# Patient Record
Sex: Female | Born: 2000 | Race: Black or African American | Hispanic: No | Marital: Single | Smoking: Former smoker
Health system: Southern US, Community
[De-identification: ages and names within clinical notes are randomized; demographics above are authoritative.]

## PROBLEM LIST (undated history)

## (undated) ENCOUNTER — Inpatient Hospital Stay (HOSPITAL_COMMUNITY): Payer: Self-pay

## (undated) DIAGNOSIS — J45909 Unspecified asthma, uncomplicated: Secondary | ICD-10-CM

## (undated) HISTORY — PX: NO PAST SURGERIES: SHX2092

---

## 2018-06-11 ENCOUNTER — Other Ambulatory Visit: Payer: Self-pay

## 2018-06-11 ENCOUNTER — Emergency Department (HOSPITAL_COMMUNITY)
Admission: EM | Admit: 2018-06-11 | Discharge: 2018-06-11 | Disposition: A | Discharge status: Home / Self Care | Payer: BLUE CROSS/BLUE SHIELD | Attending: Emergency Medicine | Admitting: Emergency Medicine

## 2018-06-11 ENCOUNTER — Encounter (HOSPITAL_COMMUNITY): Payer: Self-pay | Admitting: *Deleted

## 2018-06-11 DIAGNOSIS — J45909 Unspecified asthma, uncomplicated: Secondary | ICD-10-CM | POA: Diagnosis not present

## 2018-06-11 DIAGNOSIS — N898 Other specified noninflammatory disorders of vagina: Secondary | ICD-10-CM | POA: Diagnosis present

## 2018-06-11 DIAGNOSIS — B3731 Acute candidiasis of vulva and vagina: Secondary | ICD-10-CM

## 2018-06-11 DIAGNOSIS — Z3A01 Less than 8 weeks gestation of pregnancy: Secondary | ICD-10-CM | POA: Diagnosis not present

## 2018-06-11 DIAGNOSIS — O23599 Infection of other part of genital tract in pregnancy, unspecified trimester: Secondary | ICD-10-CM | POA: Insufficient documentation

## 2018-06-11 DIAGNOSIS — B373 Candidiasis of vulva and vagina: Secondary | ICD-10-CM | POA: Diagnosis not present

## 2018-06-11 HISTORY — DX: Unspecified asthma, uncomplicated: J45.909

## 2018-06-11 LAB — WET PREP, GENITAL
CLUE CELLS WET PREP: NONE SEEN
SPERM: NONE SEEN
TRICH WET PREP: NONE SEEN

## 2018-06-11 LAB — RPR: RPR Ser Ql: NONREACTIVE

## 2018-06-11 LAB — HIV ANTIBODY (ROUTINE TESTING W REFLEX): HIV SCREEN 4TH GENERATION: NONREACTIVE

## 2018-06-11 LAB — GC/CHLAMYDIA PROBE AMP (~~LOC~~) NOT AT ARMC
Chlamydia: NEGATIVE
Neisseria Gonorrhea: NEGATIVE

## 2018-06-11 LAB — I-STAT BETA HCG BLOOD, ED (MC, WL, AP ONLY): I-stat hCG, quantitative: 40.8 m[IU]/mL — ABNORMAL HIGH (ref <5)

## 2018-06-11 MED ORDER — FLUCONAZOLE 150 MG PO TABS: 150.0000 mg | ORAL_TABLET | Freq: Every day | ORAL | 1 refills | Status: AC

## 2018-06-11 MED ORDER — AZITHROMYCIN 250 MG PO TABS
1000.0000 mg | ORAL_TABLET | Freq: Once | ORAL | Status: AC
  Administered 2018-06-11: 1000 mg via ORAL
  Filled 2018-06-11: qty 4

## 2018-06-11 MED ORDER — PRENATAL COMPLETE 14-0.4 MG PO TABS: 1.0000 | ORAL_TABLET | Freq: Every day | ORAL | 0 refills | Status: AC

## 2018-06-11 MED ORDER — LIDOCAINE HCL 1 % IJ SOLN
INTRAMUSCULAR | Status: AC
  Administered 2018-06-11: 2.1 mL
  Filled 2018-06-11: qty 20

## 2018-06-11 MED ORDER — CEFTRIAXONE SODIUM 250 MG IJ SOLR
250.0000 mg | Freq: Once | INTRAMUSCULAR | Status: AC
  Administered 2018-06-11: 250 mg via INTRAMUSCULAR
  Filled 2018-06-11: qty 250

## 2018-06-11 NOTE — ED Provider Notes (Signed)
[facility]-EMERGENCY DEPT Provider Note   CSN: [id number] Arrival date & time: 06/11/18  0236     History   Chief Complaint Chief Complaint  Patient presents with  . Vaginal Discharge    HPI Gabrielle Chapman is a 17 y.o. female.  Patient presents with concern for vaginal itching, burning and discharge for the past for 2 weeks. No pelvic or abdominal pain. No vaginal bleeding. No fever, nausea or vomiting. She reports her boyfriend was diagnosed with trichomonas 2 weeks ago and was put on Flagyl. She was not evaluated but reports her and the boyfriend shared the Flagyl prescription. She has had persistent symptoms since.   The history is provided by the patient. No language interpreter was used.    Past Medical History:  Diagnosis Date  . Asthma     There are no active problems to display for this patient.   History reviewed. No pertinent surgical history.   OB History   None      Home Medications    Prior to Admission medications   Not on File    Family History No family history on file.  Social History Social History   Tobacco Use  . Smoking status: Never Smoker  . Smokeless tobacco: Never Used  Substance Use Topics  . Alcohol use: Never    Frequency: Never  . Drug use: Never     Allergies   Patient has no known allergies.   Review of Systems Review of Systems  Constitutional: Negative for chills and fever.  Gastrointestinal: Negative.  Negative for abdominal pain, nausea and vomiting.  Genitourinary: Positive for vaginal discharge. Negative for dysuria, pelvic pain and vaginal bleeding.       See HPI.  Musculoskeletal: Negative.  Negative for myalgias.  Skin: Negative.   Neurological: Negative.      Physical Exam Updated Vital Signs BP 135/90   Pulse (!) 117   Temp 98.1 F (36.7 C)   Resp 16   LMP 05/10/2018   SpO2 100%   Physical Exam  Constitutional: She is oriented to person, place, and time. She appears  well-developed and well-nourished.  Neck: Normal range of motion.  Pulmonary/Chest: Effort normal.  Genitourinary:  Genitourinary Comments: Mixed vaginal discharge present in vaginal vault. There is thick, clumped white discharge in vulva and introitus. There is also green, smooth consistency discharge deeper in the vaginal vault and fornix. Cervix appears unremarkable. No friability. Nontender. No adnexal tenderness or mass.   Musculoskeletal: Normal range of motion.  Neurological: She is alert and oriented to person, place, and time.  Skin: Skin is warm and dry.     ED Treatments / Results  Labs (all labs ordered are listed, but only abnormal results are displayed) Labs Reviewed  WET PREP, GENITAL  GC/CHLAMYDIA PROBE AMP () NOT AT [facility]   Results for orders placed or performed during the hospital encounter of 06/11/18  Wet prep, genital  Result Value Ref Range   Yeast Wet Prep HPF POC PRESENT (A) NONE SEEN   Trich, Wet Prep NONE SEEN NONE SEEN   Clue Cells Wet Prep HPF POC NONE SEEN NONE SEEN   WBC, Wet Prep HPF POC MANY (A) NONE SEEN   Sperm NONE SEEN   RPR  Result Value Ref Range   RPR Ser Ql Non Reactive Non Reactive  HIV antibody  Result Value Ref Range   HIV Screen 4th Generation wRfx Non Reactive Non Reactive  I-Stat beta hCG blood, ED  Result Value Ref Range   I-stat hCG, quantitative 40.8 (H) <5 mIU/mL   Comment 3          GC/Chlamydia probe amp  Result Value Ref Range   Chlamydia Negative    Neisseria gonorrhea Negative     EKG None  Radiology No results found.  Procedures Procedures (including critical care time)  Medications Ordered in ED Medications - No data to display   Initial Impression / Assessment and Plan / ED Course  I have reviewed the triage vital signs and the nursing notes.  Pertinent labs & imaging results that were available during my care of the patient were reviewed by me and considered in my medical decision making  (see chart for details).     Patient here for evaluation of vaginal discharge x 2 weeks. No fever, pelvic pain, nausea. She reports possibly pregnant. No vaginal bleeding. LMP 05/10/18.   She has mixed discharge on exam, neither of which appear to be from the cervix. No CMT or adnexal tenderness to suggest PID.   She is given Zithromax and Rocephin. Vaginal yeast is present, WBC's. I-stat Hcg positive.   Do not suspect pelvic infection, threatened miscarriage. No evidence of ectopic without pain or tenderness in what is likely very early pregnancy.   Strongly encouraged GYN follow up. Diflucan 150 mg provided with 1 refill if symptoms persist. The patient is started on prenatal vitamins.   Final Clinical Impressions(s) / ED Diagnoses   Final diagnoses:  None   1. Vaginal yeast 2. Pregnant 3. Vaginal discharge  ED Discharge Orders    None       Elpidio AnisUpstill, Tenee Wish, Cordelia Poche-C 06/11/18 2238    Shon BatonHorton, Courtney F, MD 06/12/18 [phone]

## 2018-06-11 NOTE — ED Triage Notes (Signed)
Thick yellow vaginal discharge associated with burning and itching. Pt says that she took a pregnancy test last week and it was "real faint' positive. LMP 05/09/18

## 2018-06-11 NOTE — ED Notes (Signed)
PA made aware of I-stat beta hCG results @ 0518.

## 2018-06-11 NOTE — Discharge Instructions (Signed)
Your tests are positive for vaginal yeast infection and for early pregnancy. You have been given appropriate antibiotic for other infection if your culture is positive (results in 2 days). Take prenatal vitamins daily. Use the Diflucan in 24 hours (best if used at least 24 hours after antibiotics). Refill the prescription only if you have persistent vaginal itching.

## 2018-06-11 NOTE — ED Notes (Signed)
Bed: WA23 Expected date:  Expected time:  Means of arrival:  Comments: 

## 2018-06-14 ENCOUNTER — Encounter (HOSPITAL_COMMUNITY): Payer: Self-pay | Admitting: *Deleted

## 2018-06-14 ENCOUNTER — Inpatient Hospital Stay (HOSPITAL_COMMUNITY): Payer: BLUE CROSS/BLUE SHIELD

## 2018-06-14 ENCOUNTER — Inpatient Hospital Stay (HOSPITAL_COMMUNITY)
Admission: AD | Admit: 2018-06-14 | Discharge: 2018-06-14 | Disposition: A | Discharge status: Home / Self Care | Payer: BLUE CROSS/BLUE SHIELD | Source: Ambulatory Visit | Attending: Obstetrics and Gynecology | Admitting: Obstetrics and Gynecology

## 2018-06-14 DIAGNOSIS — O039 Complete or unspecified spontaneous abortion without complication: Secondary | ICD-10-CM | POA: Diagnosis present

## 2018-06-14 DIAGNOSIS — J45909 Unspecified asthma, uncomplicated: Secondary | ICD-10-CM | POA: Insufficient documentation

## 2018-06-14 DIAGNOSIS — O209 Hemorrhage in early pregnancy, unspecified: Secondary | ICD-10-CM

## 2018-06-14 DIAGNOSIS — Z87891 Personal history of nicotine dependence: Secondary | ICD-10-CM | POA: Insufficient documentation

## 2018-06-14 LAB — ABO/RH: ABO/RH(D): A POS

## 2018-06-14 LAB — URINALYSIS, ROUTINE W REFLEX MICROSCOPIC
BACTERIA UA: NONE SEEN
Bilirubin Urine: NEGATIVE
GLUCOSE, UA: NEGATIVE mg/dL
KETONES UR: NEGATIVE mg/dL
Leukocytes, UA: NEGATIVE
NITRITE: NEGATIVE
PH: 5 (ref 5.0–8.0)
Specific Gravity, Urine: 1.03 (ref 1.005–1.030)

## 2018-06-14 LAB — CBC
HCT: 40.3 % (ref 36.0–46.0)
Hemoglobin: 13.2 g/dL (ref 12.0–15.0)
MCH: 28.1 pg (ref 26.0–34.0)
MCHC: 32.8 g/dL (ref 30.0–36.0)
MCV: 85.7 fL (ref 78.0–100.0)
PLATELETS: 348 10*3/uL (ref 150–400)
RBC: 4.7 MIL/uL (ref 3.87–5.11)
RDW: 14.1 % (ref 11.5–15.5)
WBC: 4.3 10*3/uL (ref 4.0–10.5)

## 2018-06-14 LAB — HCG, QUANTITATIVE, PREGNANCY: hCG, Beta Chain, Quant, S: 8 m[IU]/mL — ABNORMAL HIGH (ref <5)

## 2018-06-14 NOTE — MAU Provider Note (Signed)
History     CSN: [id number]  Arrival date and time: 06/14/18 [id number]   First Provider Initiated Contact with Patient 06/14/18 1102      Chief Complaint  Patient presents with  . Vaginal Bleeding   HPI  Ms.  Gabrielle Chapman is a 17 y.o. year old G1P0 female at 2924w0d weeks gestation who presents to MAU reporting she found out she was pregnant earlier this week at the ER. Yesterday she started having abdominal pain/cramping and VB with wiping. She states that this morning the VB was heavier. * Review of HCG level from 06/11/2018 = 40.8  Past Medical History:  Diagnosis Date  . Asthma     Past Surgical History:  Procedure Laterality Date  . NO PAST SURGERIES      No family history on file.  Social History   Tobacco Use  . Smoking status: Former Games developermoker  . Smokeless tobacco: Never Used  Substance Use Topics  . Alcohol use: Never    Frequency: Never  . Drug use: Never    Allergies: No Known Allergies  Medications Prior to Admission  Medication Sig Dispense Refill Last Dose  . Prenatal Vit-Fe Fumarate-FA (PRENATAL COMPLETE) 14-0.4 MG TABS Take 1 tablet by mouth daily. 60 each 0     Review of Systems  Constitutional: Negative.   HENT: Negative.   Eyes: Negative.   Respiratory: Negative.   Cardiovascular: Negative.   Gastrointestinal: Positive for abdominal pain.  Endocrine: Negative.   Genitourinary: Positive for pelvic pain and vaginal bleeding.  Musculoskeletal: Negative.   Skin: Negative.   Allergic/Immunologic: Negative.   Neurological: Negative.   Hematological: Negative.   Psychiatric/Behavioral: Negative.    Physical Exam   Blood pressure 119/79, pulse 96, temperature 98.5 F (36.9 C), temperature source Oral, resp. rate 16, height 5\' 2"  (1.575 m), weight 53.1 kg, last menstrual period 05/10/2018.  Physical Exam  Nursing note and vitals reviewed. Constitutional: She is oriented to person, place, and time. She appears well-developed and well-nourished.   HENT:  Head: Normocephalic and atraumatic.  Eyes: Pupils are equal, round, and reactive to light.  Neck: Normal range of motion.  Cardiovascular: Normal rate.  Respiratory: Effort normal.  GI: Soft.  Genitourinary:  Genitourinary Comments: Uterus: non-tender, SE: cervix is smooth, pink, no lesions, moderate amt of bright, red blood in the vaginal vault and coming from cervical os, closed/long/firm, no CMT or friability, mild LT adnexal tenderness   Musculoskeletal: Normal range of motion.  Neurological: She is alert and oriented to person, place, and time.  Skin: Skin is warm and dry.  Psychiatric: She has a normal mood and affect. Her behavior is normal. Judgment and thought content normal.    MAU Course  Procedures  MDM Results for orders placed or performed during the hospital encounter of 06/14/18 (from the past 24 hour(s))  Urinalysis, Routine w reflex microscopic     Status: Abnormal   Collection Time: 06/14/18  8:39 AM  Result Value Ref Range   Color, Urine YELLOW YELLOW   APPearance CLOUDY (A) CLEAR   Specific Gravity, Urine 1.030 1.005 - 1.030   pH 5.0 5.0 - 8.0   Glucose, UA NEGATIVE NEGATIVE mg/dL   Hgb urine dipstick LARGE (A) NEGATIVE   Bilirubin Urine NEGATIVE NEGATIVE   Ketones, ur NEGATIVE NEGATIVE mg/dL   Protein, ur >=629>=300 (A) NEGATIVE mg/dL   Nitrite NEGATIVE NEGATIVE   Leukocytes, UA NEGATIVE NEGATIVE   RBC / HPF >50 (H) 0 - 5 RBC/hpf   WBC,  UA 21-50 0 - 5 WBC/hpf   Bacteria, UA NONE SEEN NONE SEEN   Squamous Epithelial / LPF 11-20 0 - 5   Mucus PRESENT    Non Squamous Epithelial 0-5 (A) NONE SEEN  CBC     Status: None   Collection Time: 06/14/18  9:41 AM  Result Value Ref Range   WBC 4.3 4.0 - 10.5 K/uL   RBC 4.70 3.87 - 5.11 MIL/uL   Hemoglobin 13.2 12.0 - 15.0 g/dL   HCT 40.9 81.1 - 91.4 %   MCV 85.7 78.0 - 100.0 fL   MCH 28.1 26.0 - 34.0 pg   MCHC 32.8 30.0 - 36.0 g/dL   RDW 78.2 95.6 - 21.3 %   Platelets 348 150 - 400 K/uL  hCG,  quantitative, pregnancy     Status: Abnormal   Collection Time: 06/14/18  9:41 AM  Result Value Ref Range   hCG, Beta Chain, Quant, S 8 (H) <5 mIU/mL  ABO/Rh     Status: None (Preliminary result)   Collection Time: 06/14/18  9:42 AM  Result Value Ref Range   ABO/RH(D)      A POS Performed at [facility], [address], [place], [place] [address]     US Ob Less Than 14 Weeks With Ob Transvaginal  Result Date: 06/14/2018 CLINICAL DATA:  Bleeding 1st trimester EXAM: OBSTETRIC <14 WK [place] AND TRANSVAGINAL OB US TECHNIQUE: Both transabdominal and transvaginal ultrasound examinations were performed for complete evaluation of the gestation as well as the maternal uterus, adnexal regions, and pelvic cul-de-sac. Transvaginal technique was performed to assess early pregnancy. COMPARISON:  None. FINDINGS: Intrauterine gestational sac: Not visualized Yolk sac:  Not visualized Embryo:  Not visualized Cardiac Activity: Heart Rate:   bpm MSD:   mm    w     d CRL:    mm    w    d                  [place] EDC: Subchorionic hemorrhage:  None visualized. Maternal uterus/adnexae: No adnexal mass or free fluid. IMPRESSION: No intrauterine pregnancy visualized. Differential considerations would include early intrauterine pregnancy too early to visualize, spontaneous abortion, or occult ectopic pregnancy. Recommend close clinical followup and serial quantitative beta HCGs and ultrasounds. Electronically Signed   By: Charlett Nose M.D.   On: 06/14/2018 11:10    Assessment and Plan  Miscarriage  - Informed of >50% drop in HCG level from 3 days ago is dx of SAB in progress - Information provided on miscarriage - Msg sent to [facility] Admin Pool to get pt scheduled for 2 wks F/U with provider - Bleeding precautions reviewed - Discharge patient - Patient verbalized an understanding of the plan of care and agrees.     Raelyn Mora, MSN, CNM 06/14/2018, 11:02 AM

## 2018-06-14 NOTE — MAU Note (Signed)
Pt seen in ED earlier this week, found out she was pregnant.  Had some cramping yesterday, noted blood with wiping.  Bleeding is heavier this morning.

## 2018-07-01 ENCOUNTER — Ambulatory Visit: Payer: BLUE CROSS/BLUE SHIELD | Admitting: Family Medicine

## 2018-11-18 ENCOUNTER — Other Ambulatory Visit: Payer: Self-pay

## 2018-11-18 ENCOUNTER — Encounter (HOSPITAL_COMMUNITY): Payer: Self-pay | Admitting: *Deleted

## 2018-11-18 ENCOUNTER — Inpatient Hospital Stay (HOSPITAL_COMMUNITY)
Admission: AD | Admit: 2018-11-18 | Discharge: 2018-11-18 | Disposition: A | Discharge status: Home / Self Care | Payer: BLUE CROSS/BLUE SHIELD | Attending: Obstetrics and Gynecology | Admitting: Obstetrics and Gynecology

## 2018-11-18 DIAGNOSIS — O98311 Other infections with a predominantly sexual mode of transmission complicating pregnancy, first trimester: Secondary | ICD-10-CM | POA: Insufficient documentation

## 2018-11-18 DIAGNOSIS — A599 Trichomoniasis, unspecified: Secondary | ICD-10-CM

## 2018-11-18 DIAGNOSIS — Z3201 Encounter for pregnancy test, result positive: Secondary | ICD-10-CM | POA: Diagnosis not present

## 2018-11-18 DIAGNOSIS — Z3A01 Less than 8 weeks gestation of pregnancy: Secondary | ICD-10-CM | POA: Insufficient documentation

## 2018-11-18 DIAGNOSIS — N898 Other specified noninflammatory disorders of vagina: Secondary | ICD-10-CM | POA: Diagnosis not present

## 2018-11-18 DIAGNOSIS — A5901 Trichomonal vulvovaginitis: Secondary | ICD-10-CM | POA: Insufficient documentation

## 2018-11-18 DIAGNOSIS — Z87891 Personal history of nicotine dependence: Secondary | ICD-10-CM | POA: Insufficient documentation

## 2018-11-18 LAB — URINALYSIS, ROUTINE W REFLEX MICROSCOPIC
BILIRUBIN URINE: NEGATIVE
Bacteria, UA: NONE SEEN
GLUCOSE, UA: NEGATIVE mg/dL
KETONES UR: NEGATIVE mg/dL
NITRITE: NEGATIVE
PH: 6 (ref 5.0–8.0)
Protein, ur: NEGATIVE mg/dL
Specific Gravity, Urine: 1.012 (ref 1.005–1.030)

## 2018-11-18 LAB — WET PREP, GENITAL
Clue Cells Wet Prep HPF POC: NONE SEEN
SPERM: NONE SEEN
Yeast Wet Prep HPF POC: NONE SEEN

## 2018-11-18 LAB — POCT PREGNANCY, URINE: Preg Test, Ur: POSITIVE — AB

## 2018-11-18 MED ORDER — METRONIDAZOLE 500 MG PO TABS
2000.0000 mg | ORAL_TABLET | Freq: Once | ORAL | Status: AC
  Administered 2018-11-18: 2000 mg via ORAL
  Filled 2018-11-18: qty 4

## 2018-11-18 NOTE — MAU Note (Signed)
Presents with c/o foul smelling vaginal discharge.  LMP 10/04/2018.  Reports +UPT

## 2018-11-18 NOTE — Discharge Instructions (Signed)
Trichomoniasis Trichomoniasis is an STI (sexually transmitted infection) that can affect both women and men. In women, the outer area of the female genitalia (vulva) and the vagina are affected. In men, the penis is mainly affected, but the prostate and other reproductive organs can also be involved. This condition can be treated with medicine. It often has no symptoms (is asymptomatic), especially in men. What are the causes? This condition is caused by an organism called Trichomonas vaginalis. Trichomoniasis most often spreads from person to person (is contagious) through sexual contact. What increases the risk? The following factors may make you more likely to develop this condition:  Having unprotected sexual intercourse.  Having sexual intercourse with a partner who has trichomoniasis.  Having multiple sexual partners.  Having had previous trichomoniasis infections or other STIs. What are the signs or symptoms? In women, symptoms of trichomoniasis include:  Abnormal vaginal discharge that is clear, white, gray, or yellow-green and foamy and has an unusual "fishy" odor.  Itching and irritation of the vagina and vulva.  Burning or pain during urination or sexual intercourse.  Genital redness and swelling. In men, symptoms of trichomoniasis include:  Penile discharge that may be foamy or contain pus.  Pain in the penis. This may happen only when urinating.  Itching or irritation inside the penis.  Burning after urination or ejaculation. How is this diagnosed? In women, this condition may be found during a routine Pap test or physical exam. It may be found in men during a routine physical exam. Your health care provider may perform tests to help diagnose this infection, such as:  Urine tests (men and women).  The following in women: ? Testing the pH of the vagina. ? A vaginal swab test that checks for the Trichomonas vaginalis organism. ? Testing vaginal secretions. Your  health care provider may test you for other STIs, including HIV (human immunodeficiency virus). How is this treated? This condition is treated with medicine taken by mouth (orally), such as metronidazole or tinidazole to fight the infection. Your sexual partner(s) may also need to be tested and treated.  If you are a woman and you plan to become pregnant or think you may be pregnant, tell your health care provider right away. Some medicines that are used to treat the infection should not be taken during pregnancy. Your health care provider may recommend over-the-counter medicines or creams to help relieve itching or irritation. You may be tested for infection again 3 months after treatment. Follow these instructions at home:  Take and use over-the-counter and prescription medicines, including creams, only as told by your health care provider.  Do not have sexual intercourse until one week after you finish your medicine, or until your health care provider approves. Ask your health care provider when you may resume sexual intercourse.  (Women) Do not douche or wear tampons while you have the infection.  Discuss your infection with your sexual partner(s). Make sure that your partner gets tested and treated, if necessary.  Keep all follow-up visits as told by your health care provider. This is important. How is this prevented?  Use condoms every time you have sex. Using condoms correctly and consistently can help protect against STIs.  Avoid having multiple sexual partners.  Talk with your sexual partner about any symptoms that either of you may have, as well as any history of STIs.  Get tested for STIs and STDs (sexually transmitted diseases) before you have sex. Ask your partner to do the same.    Do not have sexual contact if you have symptoms of trichomoniasis or another STI. Contact a health care provider if:  You still have symptoms after you finish your medicine.  You develop pain in  your abdomen.  You have pain when you urinate.  You have bleeding after sexual intercourse.  You develop a rash.  You feel nauseous or you vomit.  You plan to become pregnant or think you may be pregnant. Summary  Trichomoniasis is an STI (sexually transmitted infection) that can affect both women and men.  This condition often has no symptoms (is asymptomatic), especially in men.  You should not have sexual intercourse until one week after you finish your medicine, or until your health care provider approves. Ask your health care provider when you may resume sexual intercourse.  Discuss your infection with your sexual partner. Make sure that your partner gets tested and treated, if necessary. This information is not intended to replace advice given to you by your health care provider. Make sure you discuss any questions you have with your health care provider. Document Released: 04/04/2001 Document Revised: 09/01/2016 Document Reviewed: 09/01/2016 Elsevier Interactive Patient Education  2019 [employer] Inc[employer]  

## 2018-11-18 NOTE — MAU Provider Note (Signed)
History     CSN: [id number]  Arrival date and time: 11/18/18 1209   None     Chief Complaint  Patient presents with  . Vaginal Discharge   HPI   Gabrielle Chapman is a 18 y.o. G2P0 female at [redacted]w[redacted]d by LMP who presents to MAU with vaginal discharge.  Reports yellow-green vaginal discharge x1 week. Notes some itching and foul odor with the discharge. Denies abnormal vaginal bleeding, pelvic pain, vomiting, fevers. Patient is sexually active. Has prior history of trichomonas.   OB History    Gravida  2   Para      Term      Preterm      AB      Living        SAB      TAB      Ectopic      Multiple      Live Births              Past Medical History:  Diagnosis Date  . Asthma     Past Surgical History:  Procedure Laterality Date  . NO PAST SURGERIES      No family history on file.  Social History   Tobacco Use  . Smoking status: Former Games developer  . Smokeless tobacco: Never Used  Substance Use Topics  . Alcohol use: Never    Frequency: Never  . Drug use: Never    Allergies: No Known Allergies  Medications Prior to Admission  Medication Sig Dispense Refill Last Dose  . Prenatal Vit-Fe Fumarate-FA (PRENATAL COMPLETE) 14-0.4 MG TABS Take 1 tablet by mouth daily. 60 each 0     Review of Systems  Constitutional: Negative for activity change, appetite change and fever.  Respiratory: Negative for chest tightness and shortness of breath.   Cardiovascular: Negative for chest pain.  Gastrointestinal: Negative for abdominal pain, constipation, diarrhea, nausea and vomiting.  Genitourinary: Positive for vaginal discharge. Negative for dysuria, frequency, pelvic pain, urgency and vaginal bleeding.   Physical Exam   Blood pressure 119/63, pulse 92, temperature 98.5 F (36.9 C), temperature source Oral, resp. rate 20, height 5\' 2"  (1.575 m), weight 52.1 kg, last menstrual period 10/04/2018, SpO2 100 %, unknown if currently breastfeeding.  Physical Exam   Constitutional: She is oriented to person, place, and time. She appears well-developed and well-nourished. No distress.  HENT:  Head: Normocephalic and atraumatic.  Eyes: Conjunctivae and EOM are normal.  Neck: Normal range of motion. Neck supple.  Cardiovascular: Normal rate and regular rhythm.  Respiratory: Effort normal and breath sounds normal. No respiratory distress.  GI: Soft. She exhibits no distension. There is no abdominal tenderness.  Musculoskeletal: Normal range of motion.        General: No edema.  Neurological: She is alert and oriented to person, place, and time.  Skin: Skin is warm and dry.  Psychiatric: She has a normal mood and affect. Her behavior is normal.    MAU Course  Procedures  MDM Upreg positive. Patient self collected wet prep and cultures. UA collected.   Assessment and Plan   1. Vaginal discharge   2. Vaginal irritation   3. Trichimoniasis    Wet prep positive for WBCs and trichomonas. Will treat in MAU with Flagyl 2g. Patient stable for discharge as she is otherwise asymptomatic and her vital signs are stable. UA notable for large leukocytes, otherwise unremarkable. GC/Chlamydia pending. Patient instructed to abstain from sexual intercourse x1 week. Discussed safe sex and need to  have partners tested/treated to prevent re-infection. Encouraged to establish OB care. Return precautions discussed.   De Hollingshead 11/18/2018, 2:04 PM

## 2018-11-19 LAB — GC/CHLAMYDIA PROBE AMP (~~LOC~~) NOT AT ARMC
CHLAMYDIA, DNA PROBE: NEGATIVE
Neisseria Gonorrhea: NEGATIVE

## 2020-04-16 IMAGING — US US OB < 14 WEEKS - US OB TV
1 series · 15 of 27 positions shown · non-contrast
Comparison: None.

CLINICAL DATA: Bleeding 1st trimester

EXAM:
OBSTETRIC <14 WK US AND TRANSVAGINAL OB US
TECHNIQUE: Both transabdominal and transvaginal ultrasound examinations were
performed for complete evaluation of the gestation as well as the
maternal uterus, adnexal regions, and pelvic cul-de-sac.
Transvaginal technique was performed to assess early pregnancy.

[Series 1: us ob < 14 weeks - us ob tv · 27 acquisitions, 15 frames shown]
[im 1/27]
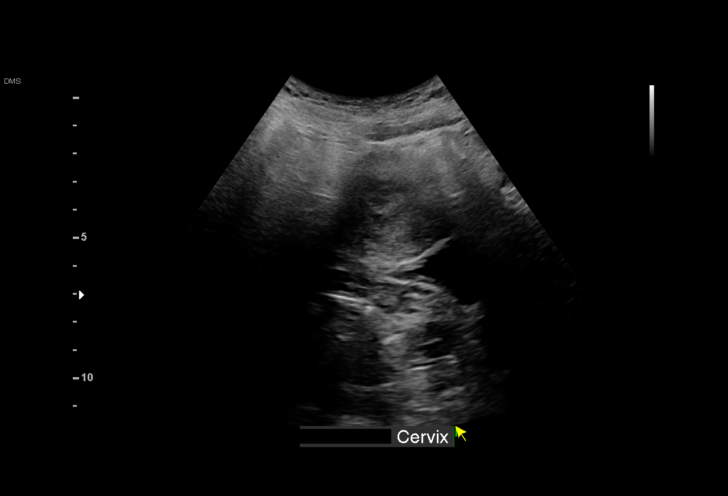
[im 3/27]
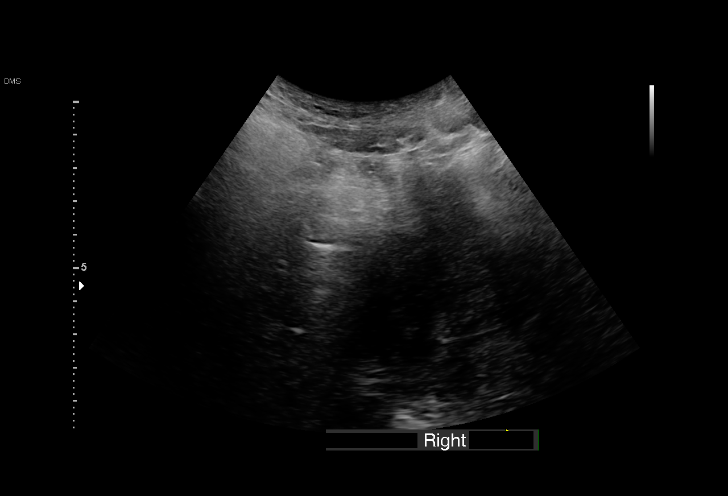
[im 5/27]
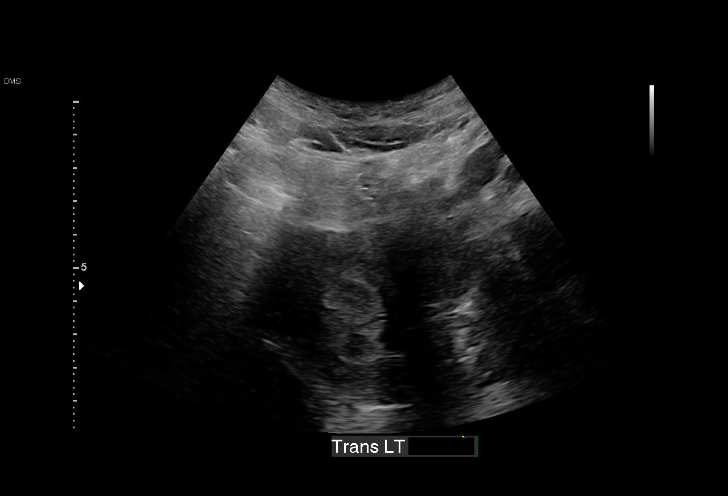
[im 7/27]
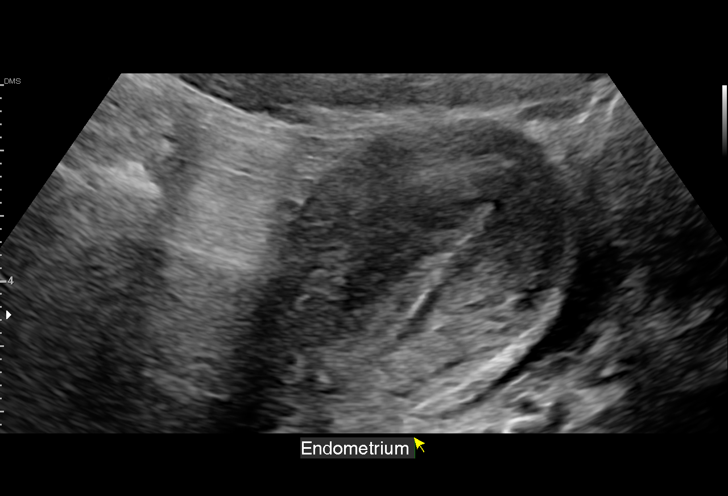
[im 9/27]
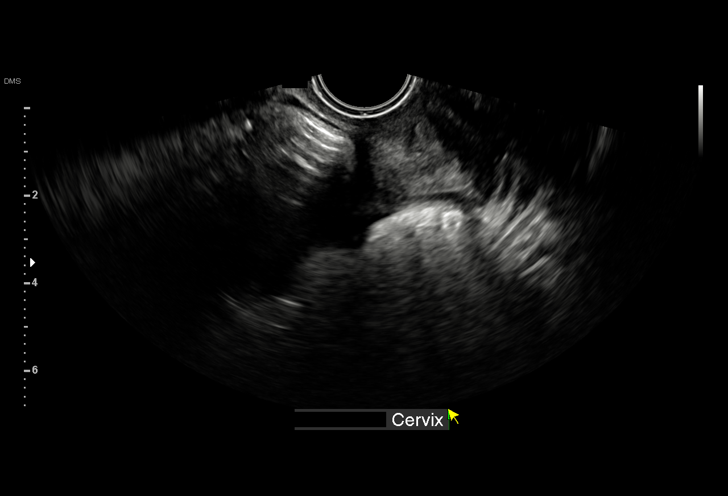
[im 10/27]
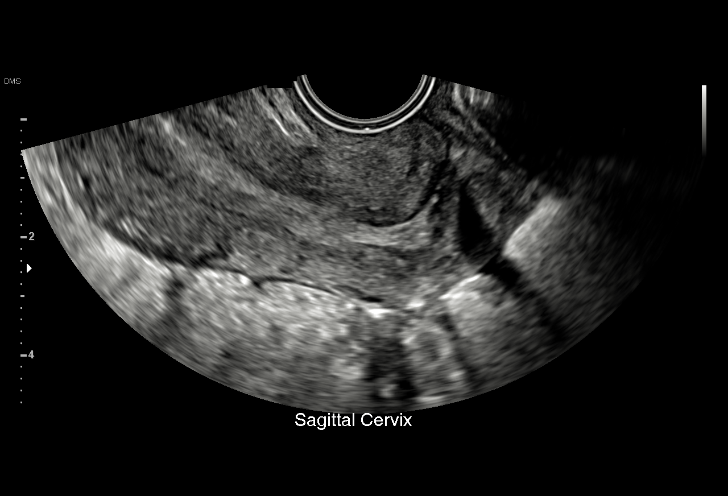
[im 12/27]
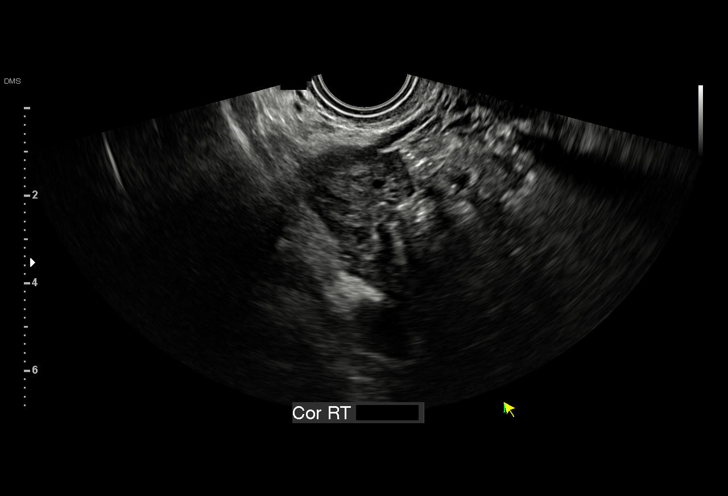
[im 14/27]
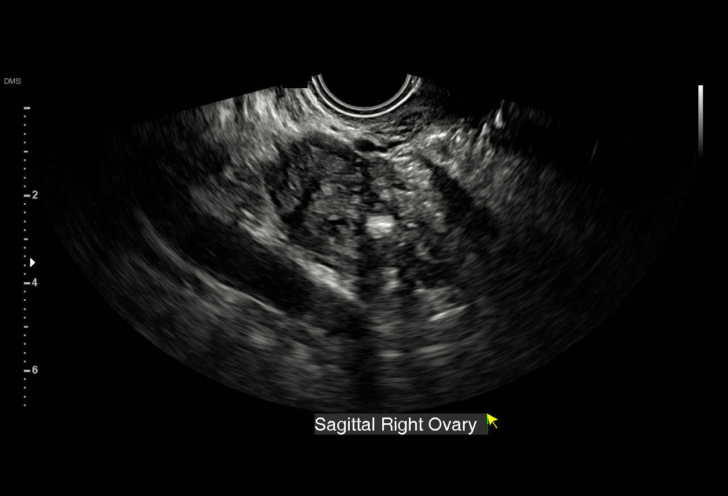
[im 16/27]
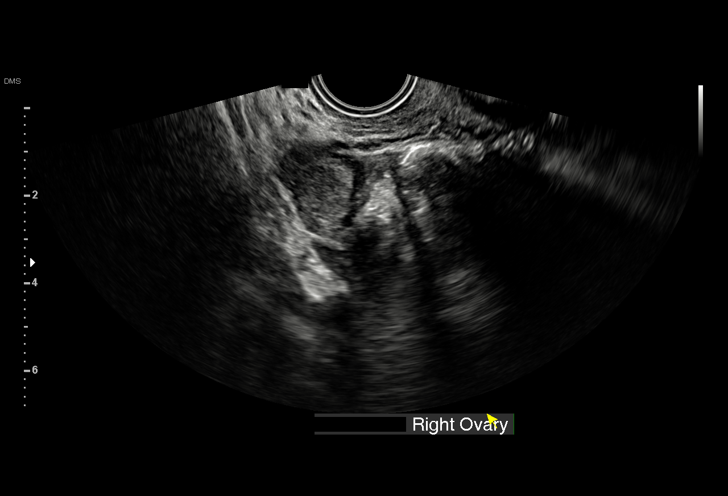
[im 18/27]
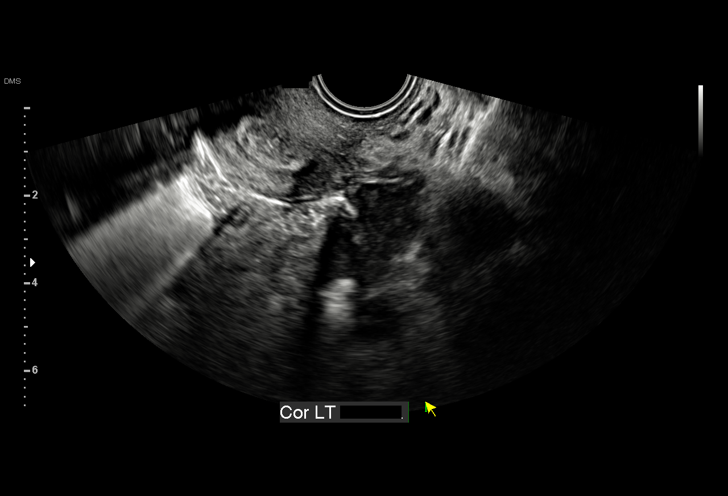
[im 19/27]
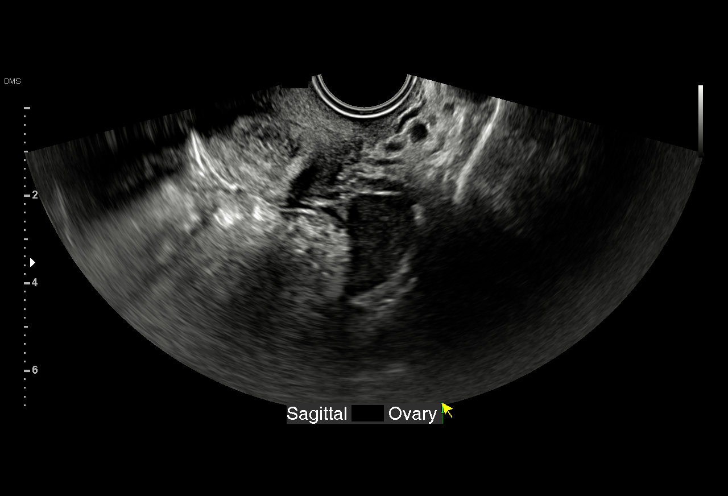
[im 21/27]
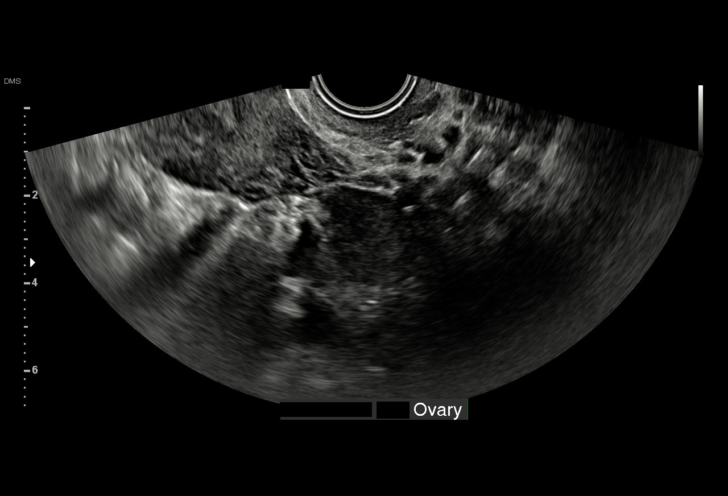
[im 23/27]
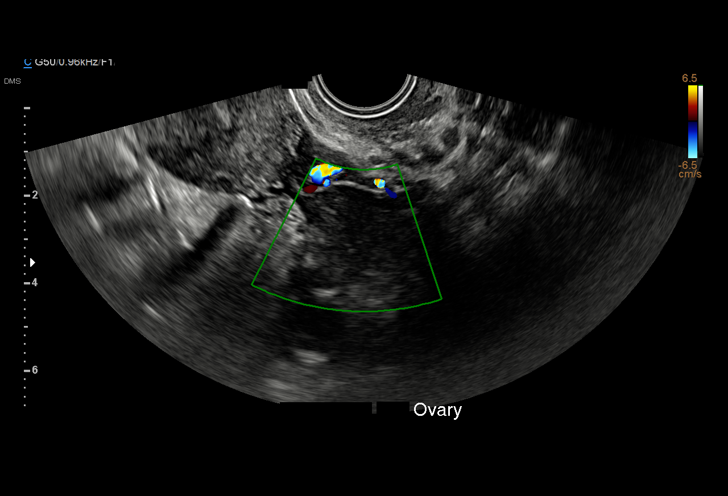
[im 25/27]
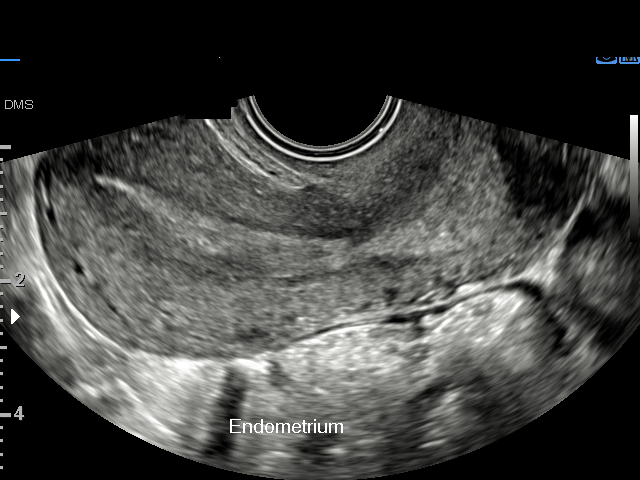
[im 27/27]
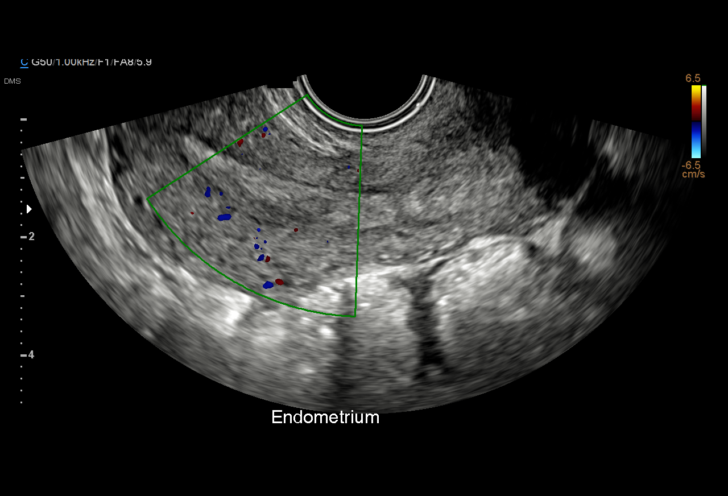

[15 of 27 positions shown; findings below may reference images not displayed]

FINDINGS: Intrauterine gestational sac: Not visualized

Yolk sac:  Not visualized

Embryo:  Not visualized

Cardiac Activity:

Heart Rate:   bpm

MSD:   mm    w     d

CRL:    mm    w    d                  US EDC:

Subchorionic hemorrhage:  None visualized.

Maternal uterus/adnexae: No adnexal mass or free fluid.
IMPRESSION: No intrauterine pregnancy visualized. Differential considerations
would include early intrauterine pregnancy too early to visualize,
spontaneous abortion, or occult ectopic pregnancy. Recommend close
clinical followup and serial quantitative beta HCGs and ultrasounds.
